# Patient Record
Sex: Male | Born: 2000 | Race: White | Hispanic: No
Health system: Southern US, Community
[De-identification: ages and names within clinical notes are randomized; demographics above are authoritative.]

## PROBLEM LIST (undated history)

## (undated) DIAGNOSIS — F419 Anxiety disorder, unspecified: Secondary | ICD-10-CM

## (undated) DIAGNOSIS — K5900 Constipation, unspecified: Secondary | ICD-10-CM

## (undated) DIAGNOSIS — G43909 Migraine, unspecified, not intractable, without status migrainosus: Secondary | ICD-10-CM

## (undated) DIAGNOSIS — T7840XA Allergy, unspecified, initial encounter: Secondary | ICD-10-CM

## (undated) HISTORY — DX: Anxiety disorder, unspecified: F41.9

## (undated) HISTORY — PX: CIRCUMCISION: SUR203

## (undated) HISTORY — DX: Constipation, unspecified: K59.00

## (undated) HISTORY — PX: ADENOIDECTOMY AND MYRINGOTOMY WITH TUBE PLACEMENT: SHX5714

## (undated) HISTORY — PX: TONSILLECTOMY: SUR1361

## (undated) HISTORY — DX: Allergy, unspecified, initial encounter: T78.40XA

---
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
## 2022-03-02 ENCOUNTER — Other Ambulatory Visit: Payer: Self-pay | Admitting: Physician Assistant

## 2022-05-27 ENCOUNTER — Other Ambulatory Visit: Payer: Self-pay | Admitting: Physician Assistant

## 2022-08-22 ENCOUNTER — Other Ambulatory Visit: Payer: Self-pay | Admitting: Physician Assistant

## 2023-10-30 ENCOUNTER — Ambulatory Visit: Admitting: Podiatry

## 2023-10-30 VITALS — Ht 74.0 in | Wt 210.0 lb

## 2023-10-30 DIAGNOSIS — L600 Ingrowing nail: Secondary | ICD-10-CM | POA: Diagnosis not present

## 2023-10-30 MED ORDER — NEOMYCIN-POLYMYXIN-HC 3.5-10000-1 OT SUSP: OTIC | 0 refills | Status: AC

## 2023-10-30 NOTE — Patient Instructions (Signed)

## 2023-11-02 ENCOUNTER — Encounter: Payer: Self-pay | Admitting: Podiatry

## 2023-11-02 NOTE — Progress Notes (Signed)
  Subjective:  Patient ID: Justin Singleton, male    DOB: 2000-03-12,  MRN: [id number]  Chief Complaint  Patient presents with   Ingrown Toenail    RM 4 Patient is here for ingrown toenail of the right hallux (medial). Right Hallus has some redness and swelling on the medial side. Symptoms has been present oft one week.    23 y.o. male presents with the above complaint. History confirmed with patient.   Objective:  Physical Exam: warm, good capillary refill, no trophic changes or ulcerative lesions, normal DP and PT pulses, normal sensory exam, and ingrown medial hallux right.  Assessment:   1. Ingrowing right great toenail      Plan:  Patient was evaluated and treated and all questions answered.    Ingrown Nail, right -Patient elects to proceed with minor surgery to remove ingrown toenail today. Consent reviewed and signed by patient. -Ingrown nail excised. See procedure note. -Educated on post-procedure care including soaking. Written instructions provided and reviewed. -Rx for Cortisporin sent to pharmacy. -Advised on signs and symptoms of infection developing.  We discussed that the phenol likely will create some redness and edema and tenderness around the nailbed as long as it is localized this is to be expected.  Will return as needed if any infection signs develop  Procedure: Excision of Ingrown Toenail Location: Right 1st toe medial nail borders. Anesthesia: Lidocaine 1% plain; 1.5 mL and Marcaine 0.5% plain; 1.5 mL, digital block. Skin Prep: Betadine. Dressing: Silvadene; telfa; dry, sterile, compression dressing. Technique: Following skin prep, the toe was exsanguinated and a tourniquet was secured at the base of the toe. The affected nail border was freed, split with a nail splitter, and excised. Chemical matrixectomy was then performed with phenol and irrigated out with alcohol. The tourniquet was then removed and sterile dressing applied. Disposition: Patient  tolerated procedure well.    Return if symptoms worsen or fail to improve.
